# Patient Record
Sex: Male | Born: 1945 | Hispanic: No | Marital: Married | Smoking: Former smoker
Health system: Southern US, Community
[De-identification: ages and names within clinical notes are randomized; demographics above are authoritative.]

## PROBLEM LIST (undated history)

## (undated) DIAGNOSIS — D649 Anemia, unspecified: Secondary | ICD-10-CM

## (undated) DIAGNOSIS — I10 Essential (primary) hypertension: Secondary | ICD-10-CM

## (undated) DIAGNOSIS — E119 Type 2 diabetes mellitus without complications: Secondary | ICD-10-CM

## (undated) DIAGNOSIS — E785 Hyperlipidemia, unspecified: Secondary | ICD-10-CM

## (undated) DIAGNOSIS — N183 Chronic kidney disease, stage 3 unspecified: Secondary | ICD-10-CM

## (undated) HISTORY — DX: Type 2 diabetes mellitus without complications: E11.9

## (undated) HISTORY — DX: Chronic kidney disease, stage 3 unspecified: N18.30

## (undated) HISTORY — DX: Essential (primary) hypertension: I10

## (undated) HISTORY — DX: Anemia, unspecified: D64.9

## (undated) HISTORY — DX: Hyperlipidemia, unspecified: E78.5

## (undated) HISTORY — PX: OTHER SURGICAL HISTORY: SHX169

---

## 2003-07-26 ENCOUNTER — Ambulatory Visit (HOSPITAL_COMMUNITY): Admission: RE | Admit: 2003-07-26 | Discharge: 2003-07-26 | Payer: Self-pay | Admitting: Gastroenterology

## 2015-02-01 ENCOUNTER — Other Ambulatory Visit: Payer: Self-pay | Admitting: Gastroenterology

## 2018-08-14 DIAGNOSIS — I10 Essential (primary) hypertension: Secondary | ICD-10-CM | POA: Diagnosis not present

## 2018-08-14 DIAGNOSIS — N183 Chronic kidney disease, stage 3 (moderate): Secondary | ICD-10-CM | POA: Diagnosis not present

## 2018-08-14 DIAGNOSIS — E7800 Pure hypercholesterolemia, unspecified: Secondary | ICD-10-CM | POA: Diagnosis not present

## 2018-08-14 DIAGNOSIS — E1122 Type 2 diabetes mellitus with diabetic chronic kidney disease: Secondary | ICD-10-CM | POA: Diagnosis not present

## 2019-01-01 ENCOUNTER — Other Ambulatory Visit: Payer: Self-pay | Admitting: Family Medicine

## 2019-01-01 ENCOUNTER — Encounter: Payer: Self-pay | Admitting: Cardiology

## 2019-01-01 DIAGNOSIS — Z23 Encounter for immunization: Secondary | ICD-10-CM | POA: Diagnosis not present

## 2019-01-01 DIAGNOSIS — Z125 Encounter for screening for malignant neoplasm of prostate: Secondary | ICD-10-CM | POA: Diagnosis not present

## 2019-01-01 DIAGNOSIS — E1122 Type 2 diabetes mellitus with diabetic chronic kidney disease: Secondary | ICD-10-CM | POA: Diagnosis not present

## 2019-01-01 DIAGNOSIS — I10 Essential (primary) hypertension: Secondary | ICD-10-CM | POA: Diagnosis not present

## 2019-01-01 DIAGNOSIS — Z0000 Encounter for general adult medical examination without abnormal findings: Secondary | ICD-10-CM | POA: Diagnosis not present

## 2019-01-01 DIAGNOSIS — Z136 Encounter for screening for cardiovascular disorders: Secondary | ICD-10-CM

## 2019-01-01 DIAGNOSIS — N1831 Chronic kidney disease, stage 3a: Secondary | ICD-10-CM | POA: Diagnosis not present

## 2019-01-01 DIAGNOSIS — D229 Melanocytic nevi, unspecified: Secondary | ICD-10-CM | POA: Diagnosis not present

## 2019-01-01 DIAGNOSIS — E7800 Pure hypercholesterolemia, unspecified: Secondary | ICD-10-CM | POA: Diagnosis not present

## 2019-01-01 DIAGNOSIS — R9431 Abnormal electrocardiogram [ECG] [EKG]: Secondary | ICD-10-CM | POA: Diagnosis not present

## 2019-03-03 ENCOUNTER — Ambulatory Visit
Admission: RE | Admit: 2019-03-03 | Discharge: 2019-03-03 | Disposition: A | Discharge status: Home / Self Care | Payer: Medicare HMO | Source: Ambulatory Visit | Attending: Family Medicine | Admitting: Family Medicine

## 2019-03-03 DIAGNOSIS — Z136 Encounter for screening for cardiovascular disorders: Secondary | ICD-10-CM | POA: Diagnosis not present

## 2019-03-03 DIAGNOSIS — Z87891 Personal history of nicotine dependence: Secondary | ICD-10-CM | POA: Diagnosis not present

## 2019-03-05 NOTE — Progress Notes (Signed)
Clinton Forts, MD Reason for referral-abnormal ECG  HPI: 74 year old male for evaluation of abnormal ECG at request of Antony Contras, MD.  Abdominal ultrasound January 2021 showed no aneurysm.  Laboratories November 2020 showed total cholesterol 173 with LDL 77.  Liver functions normal.  Recent electrocardiogram said to have inferior Q waves and cardiology asked to evaluate.  Patient denies dyspnea, chest pain, palpitations or syncope.  No claudication.  Current Outpatient Medications  Medication Sig Dispense Refill  . ACCU-CHEK AVIVA PLUS test strip     . Blood Glucose Calibration (ACCU-CHEK AVIVA) SOLN     . diltiazem (CARDIZEM CD) 300 MG 24 hr capsule Take 300 mg by mouth daily.    Marland Kitchen glimepiride (AMARYL) 2 MG tablet Take 1 tablet by mouth daily.    Marland Kitchen lisinopril (ZESTRIL) 20 MG tablet Take 1 tablet by mouth daily.    . rosuvastatin (CRESTOR) 5 MG tablet Take 1 tablet by mouth daily.     No current facility-administered medications for this visit.    Not on File   Past Medical History:  Diagnosis Date  . Anemia   . Chronic kidney disease (CKD), stage III (moderate)   . Diabetes mellitus ([place])   . Hyperlipidemia   . Hypertension     Past Surgical History:  Procedure Laterality Date  . No prior surgery      Social History   Socioeconomic History  . Marital status: Married    Spouse name: Not on file  . Number of children: Not on file  . Years of education: Not on file  . Highest education level: Not on file  Occupational History  . Not on file  Tobacco Use  . Smoking status: Former Research scientist (life sciences)  . Smokeless tobacco: Never Used  Substance and Sexual Activity  . Alcohol use: Never  . Drug use: Not on file  . Sexual activity: Not on file  Other Topics Concern  . Not on file  Social History Narrative  . Not on file   Social Determinants of Health   Financial Resource Strain:   . Difficulty of Paying Living Expenses: Not on file  Food Insecurity:   .  Worried About Charity fundraiser in the Last Year: Not on file  . Ran Out of Food in the Last Year: Not on file  Transportation Needs:   . Lack of Transportation (Medical): Not on file  . Lack of Transportation (Non-Medical): Not on file  Physical Activity:   . Days of Exercise per Week: Not on file  . Minutes of Exercise per Session: Not on file  Stress:   . Feeling of Stress : Not on file  Social Connections:   . Frequency of Communication with Friends and Family: Not on file  . Frequency of Social Gatherings with Friends and Family: Not on file  . Attends Religious Services: Not on file  . Active Member of Clubs or Organizations: Not on file  . Attends Archivist Meetings: Not on file  . Marital Status: Not on file  Intimate Partner Violence:   . Fear of Current or Ex-Partner: Not on file  . Emotionally Abused: Not on file  . Physically Abused: Not on file  . Sexually Abused: Not on file    Family History  Problem Relation Age of Onset  . Hypertension Father     ROS: Arthralgias but no fevers or chills, productive cough, hemoptysis, dysphasia, odynophagia, melena, hematochezia, dysuria, hematuria, rash, seizure activity, orthopnea, PND, pedal  edema, claudication. Remaining systems are negative.  Physical Exam:   Blood pressure (!) 158/88, pulse 97, height 5\' 6"  (1.676 m), weight 154 lb (69.9 kg), SpO2 95 %.  General:  Well developed/well nourished in NAD Skin warm/dry Patient not depressed No peripheral clubbing Back-normal HEENT-normal/normal eyelids Neck supple/normal carotid upstroke bilaterally; no bruits; no JVD; no thyromegaly chest - CTA/ normal expansion CV - RRR/normal S1 and S2; no murmurs, rubs or gallops;  PMI nondisplaced Abdomen -NT/ND, no HSM, no mass, + bowel sounds, positive bruit 2+ femoral pulses, no bruits Ext-no edema, chords, 2+ DP Neuro-grossly nonfocal  ECG -normal sinus rhythm, inferior infarct, cannot rule out septal infarct.   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  Patient has no prior cardiac symptoms including no chest pain or dyspnea.  I will arrange an echocardiogram to quantify LV function.  We will arrange a stress nuclear study to screen for prior infarct and to rule out ischemia.  Continue aspirin and statin.  2 bruit-arrange abdominal ultrasound to exclude aneurysm.  3 hyperlipidemia-continue statin.  If coronary artery disease demonstrated would need to increase dose.  4 hypertension-blood pressure elevated today but he follows this at home and it is typically controlled.  Continue present medications and follow.  Kirk Ruths, MD

## 2019-03-10 ENCOUNTER — Encounter: Payer: Self-pay | Admitting: Cardiology

## 2019-03-10 ENCOUNTER — Ambulatory Visit (INDEPENDENT_AMBULATORY_CARE_PROVIDER_SITE_OTHER): Payer: Medicare HMO | Admitting: Cardiology

## 2019-03-10 ENCOUNTER — Other Ambulatory Visit: Payer: Self-pay

## 2019-03-10 VITALS — BP 158/88 | HR 97 | Ht 66.0 in | Wt 154.0 lb

## 2019-03-10 DIAGNOSIS — R9431 Abnormal electrocardiogram [ECG] [EKG]: Secondary | ICD-10-CM | POA: Diagnosis not present

## 2019-03-10 DIAGNOSIS — I10 Essential (primary) hypertension: Secondary | ICD-10-CM

## 2019-03-10 DIAGNOSIS — E7800 Pure hypercholesterolemia, unspecified: Secondary | ICD-10-CM | POA: Diagnosis not present

## 2019-03-10 NOTE — Patient Instructions (Signed)
Medication Instructions:  NO CHANGE *If you need a refill on your cardiac medications before your next appointment, please call your pharmacy*  Lab Work: If you have labs (blood work) drawn today and your tests are completely normal, you will receive your results only by: Marland Kitchen MyChart Message (if you have MyChart) OR . A paper copy in the mail If you have any lab test that is abnormal or we need to change your treatment, we will call you to review the results.  Testing/Procedures: Your physician has requested that you have an echocardiogram. Echocardiography is a painless test that uses sound waves to create images of your heart. It provides your doctor with information about the size and shape of your heart and how well your heart's chambers and valves are working. This procedure takes approximately one hour. There are no restrictions for this procedure.[place] has requested that you have a lexiscan myoview. For further information please visit [email or web address]. Please follow instruction sheet, as given.Jane Lew has requested that you have an abdominal aorta duplex. During this test, an ultrasound is used to evaluate the aorta. Allow 30 minutes for this exam. Do not eat after midnight the day before and avoid carbonated beverages [facility]   Follow-Up: At [facility], you and your health needs are our priority.  As part of our continuing mission to provide you with exceptional heart care, we have created designated Provider Care Teams.  These Care Teams include your primary Cardiologist (physician) and Advanced Practice Providers (APPs -  Physician Assistants and Nurse Practitioners) who all work together to provide you with the care you need, when you need it.  Your next appointment:   3 month(s)  The format for your next appointment:   In Person  Provider:   Kirk Ruths, MD

## 2019-03-11 ENCOUNTER — Other Ambulatory Visit: Payer: Self-pay | Admitting: Cardiology

## 2019-03-11 ENCOUNTER — Telehealth (HOSPITAL_COMMUNITY): Payer: Self-pay

## 2019-03-11 DIAGNOSIS — R0989 Other specified symptoms and signs involving the circulatory and respiratory systems: Secondary | ICD-10-CM

## 2019-03-11 NOTE — Telephone Encounter (Signed)
Encounter complete. 

## 2019-03-12 ENCOUNTER — Telehealth (HOSPITAL_COMMUNITY): Payer: Self-pay

## 2019-03-12 NOTE — Telephone Encounter (Signed)
Encounter complete. 

## 2019-03-13 ENCOUNTER — Ambulatory Visit (HOSPITAL_COMMUNITY)
Admission: RE | Admit: 2019-03-13 | Discharge: 2019-03-13 | Disposition: A | Discharge status: Home / Self Care | Payer: Medicare HMO | Source: Ambulatory Visit | Attending: Internal Medicine | Admitting: Internal Medicine

## 2019-03-13 ENCOUNTER — Other Ambulatory Visit: Payer: Self-pay

## 2019-03-13 DIAGNOSIS — Z7982 Long term (current) use of aspirin: Secondary | ICD-10-CM | POA: Diagnosis not present

## 2019-03-13 DIAGNOSIS — Z79899 Other long term (current) drug therapy: Secondary | ICD-10-CM | POA: Insufficient documentation

## 2019-03-13 DIAGNOSIS — I129 Hypertensive chronic kidney disease with stage 1 through stage 4 chronic kidney disease, or unspecified chronic kidney disease: Secondary | ICD-10-CM | POA: Diagnosis not present

## 2019-03-13 DIAGNOSIS — R9431 Abnormal electrocardiogram [ECG] [EKG]: Secondary | ICD-10-CM | POA: Insufficient documentation

## 2019-03-13 DIAGNOSIS — Z7984 Long term (current) use of oral hypoglycemic drugs: Secondary | ICD-10-CM | POA: Diagnosis not present

## 2019-03-13 DIAGNOSIS — E1122 Type 2 diabetes mellitus with diabetic chronic kidney disease: Secondary | ICD-10-CM | POA: Insufficient documentation

## 2019-03-13 DIAGNOSIS — N183 Chronic kidney disease, stage 3 unspecified: Secondary | ICD-10-CM | POA: Insufficient documentation

## 2019-03-13 DIAGNOSIS — Z87891 Personal history of nicotine dependence: Secondary | ICD-10-CM | POA: Diagnosis not present

## 2019-03-13 LAB — MYOCARDIAL PERFUSION IMAGING
LV dias vol: 58 mL (ref 62–150)
LV sys vol: 21 mL
Peak HR: 110 {beats}/min
Rest HR: 81 {beats}/min
SDS: 8
SRS: 0
SSS: 8
TID: 0.95

## 2019-03-13 MED ORDER — TECHNETIUM TC 99M TETROFOSMIN IV KIT
9.6000 | PACK | Freq: Once | INTRAVENOUS | Status: AC | PRN
  Administered 2019-03-13: 9.6 via INTRAVENOUS
  Filled 2019-03-13: qty 10

## 2019-03-13 MED ORDER — TECHNETIUM TC 99M TETROFOSMIN IV KIT
31.2000 | PACK | Freq: Once | INTRAVENOUS | Status: AC | PRN
  Administered 2019-03-13: 31.2 via INTRAVENOUS
  Filled 2019-03-13: qty 32

## 2019-03-13 MED ORDER — REGADENOSON 0.4 MG/5ML IV SOLN
0.4000 mg | Freq: Once | INTRAVENOUS | Status: AC
  Administered 2019-03-13: 0.4 mg via INTRAVENOUS

## 2019-03-19 ENCOUNTER — Encounter (HOSPITAL_COMMUNITY): Payer: Self-pay

## 2019-03-19 ENCOUNTER — Other Ambulatory Visit: Payer: Self-pay

## 2019-03-19 ENCOUNTER — Ambulatory Visit (HOSPITAL_COMMUNITY)
Admission: RE | Admit: 2019-03-19 | Discharge: 2019-03-19 | Disposition: A | Discharge status: Home / Self Care | Payer: Medicare HMO | Source: Ambulatory Visit | Attending: Cardiovascular Disease | Admitting: Cardiovascular Disease

## 2019-03-19 DIAGNOSIS — R0989 Other specified symptoms and signs involving the circulatory and respiratory systems: Secondary | ICD-10-CM

## 2019-03-20 ENCOUNTER — Ambulatory Visit (HOSPITAL_COMMUNITY): Payer: Medicare HMO | Attending: Cardiology

## 2019-03-20 DIAGNOSIS — R9431 Abnormal electrocardiogram [ECG] [EKG]: Secondary | ICD-10-CM

## 2019-05-07 DIAGNOSIS — E785 Hyperlipidemia, unspecified: Secondary | ICD-10-CM | POA: Diagnosis not present

## 2019-05-07 DIAGNOSIS — E1165 Type 2 diabetes mellitus with hyperglycemia: Secondary | ICD-10-CM | POA: Diagnosis not present

## 2019-05-07 DIAGNOSIS — E1122 Type 2 diabetes mellitus with diabetic chronic kidney disease: Secondary | ICD-10-CM | POA: Diagnosis not present

## 2019-05-07 DIAGNOSIS — I10 Essential (primary) hypertension: Secondary | ICD-10-CM | POA: Diagnosis not present

## 2019-05-07 DIAGNOSIS — N183 Chronic kidney disease, stage 3 unspecified: Secondary | ICD-10-CM | POA: Diagnosis not present

## 2019-05-07 DIAGNOSIS — E119 Type 2 diabetes mellitus without complications: Secondary | ICD-10-CM | POA: Diagnosis not present

## 2019-05-07 DIAGNOSIS — D509 Iron deficiency anemia, unspecified: Secondary | ICD-10-CM | POA: Diagnosis not present

## 2019-05-07 DIAGNOSIS — E7800 Pure hypercholesterolemia, unspecified: Secondary | ICD-10-CM | POA: Diagnosis not present

## 2019-06-30 NOTE — Progress Notes (Deleted)
HPI: Follow-up abnormal ECG. Abdominal ultrasound January 2021 showed no aneurysm.  Nuclear study 1/21 showed EF 64 and no ischemia or infarction. Echo 2/21 showed normal LV function, grade 1 diastolic dysfunction.  Current Outpatient Medications  Medication Sig Dispense Refill  . ACCU-CHEK AVIVA PLUS test strip     . Blood Glucose Calibration (ACCU-CHEK AVIVA) SOLN     . diltiazem (CARDIZEM CD) 300 MG 24 hr capsule Take 300 mg by mouth daily.    Marland Kitchen glimepiride (AMARYL) 2 MG tablet Take 1 tablet by mouth daily.    Marland Kitchen lisinopril (ZESTRIL) 20 MG tablet Take 1 tablet by mouth daily.    . rosuvastatin (CRESTOR) 5 MG tablet Take 1 tablet by mouth daily.     No current facility-administered medications for this visit.     Past Medical History:  Diagnosis Date  . Anemia   . Chronic kidney disease (CKD), stage III (moderate)   . Diabetes mellitus ([place])   . Hyperlipidemia   . Hypertension     Past Surgical History:  Procedure Laterality Date  . No prior surgery      Social History   Socioeconomic History  . Marital status: Married    Spouse name: Not on file  . Number of children: Not on file  . Years of education: Not on file  . Highest education level: Not on file  Occupational History  . Not on file  Tobacco Use  . Smoking status: Former Research scientist (life sciences)  . Smokeless tobacco: Never Used  Substance and Sexual Activity  . Alcohol use: Never  . Drug use: Not on file  . Sexual activity: Not on file  Other Topics Concern  . Not on file  Social History Narrative  . Not on file   Social Determinants of Health   Financial Resource Strain:   . Difficulty of Paying Living Expenses:   Food Insecurity:   . Worried About Charity fundraiser in the Last Year:   . Arboriculturist in the Last Year:   Transportation Needs:   . Film/video editor (Medical):   Marland Kitchen Lack of Transportation (Non-Medical):   Physical Activity:   . Days of Exercise per Week:   . Minutes of Exercise  per Session:   Stress:   . Feeling of Stress :   Social Connections:   . Frequency of Communication with Friends and Family:   . Frequency of Social Gatherings with Friends and Family:   . Attends Religious Services:   . Active Member of Clubs or Organizations:   . Attends Archivist Meetings:   Marland Kitchen Marital Status:   Intimate Partner Violence:   . Fear of Current or Ex-Partner:   . Emotionally Abused:   Marland Kitchen Physically Abused:   . Sexually Abused:     Family History  Problem Relation Age of Onset  . Hypertension Father     ROS: no fevers or chills, productive cough, hemoptysis, dysphasia, odynophagia, melena, hematochezia, dysuria, hematuria, rash, seizure activity, orthopnea, PND, pedal edema, claudication. Remaining systems are negative.  Physical Exam: Well-developed well-nourished in no acute distress.  Skin is warm and dry.  HEENT is normal.  Neck is supple.  Chest is clear to auscultation with normal expansion.  Cardiovascular exam is regular rate and rhythm.  Abdominal exam nontender or distended. No masses palpated. Extremities show no edema. neuro grossly intact  ECG- personally reviewed  A/P  1 abnormal ECG-there was a question of inferior infarct  on prior ECG but echocardiogram shows normal wall motion/normal LV function and nuclear study showed normal perfusion.  No further evaluation.  2 hypertension-blood pressure controlled.  Continue present medical regimen.  3 hyperlipidemia-continue statin.  Kirk Ruths, MD

## 2019-07-03 DIAGNOSIS — E1122 Type 2 diabetes mellitus with diabetic chronic kidney disease: Secondary | ICD-10-CM | POA: Diagnosis not present

## 2019-07-03 DIAGNOSIS — D229 Melanocytic nevi, unspecified: Secondary | ICD-10-CM | POA: Diagnosis not present

## 2019-07-03 DIAGNOSIS — N1831 Chronic kidney disease, stage 3a: Secondary | ICD-10-CM | POA: Diagnosis not present

## 2019-07-03 DIAGNOSIS — I10 Essential (primary) hypertension: Secondary | ICD-10-CM | POA: Diagnosis not present

## 2019-07-03 DIAGNOSIS — I700 Atherosclerosis of aorta: Secondary | ICD-10-CM | POA: Diagnosis not present

## 2019-07-03 DIAGNOSIS — E7800 Pure hypercholesterolemia, unspecified: Secondary | ICD-10-CM | POA: Diagnosis not present

## 2019-07-07 ENCOUNTER — Ambulatory Visit: Payer: Medicare HMO | Admitting: Cardiology

## 2019-07-07 DIAGNOSIS — E1165 Type 2 diabetes mellitus with hyperglycemia: Secondary | ICD-10-CM | POA: Diagnosis not present

## 2019-07-07 DIAGNOSIS — E1122 Type 2 diabetes mellitus with diabetic chronic kidney disease: Secondary | ICD-10-CM | POA: Diagnosis not present

## 2019-07-07 DIAGNOSIS — E785 Hyperlipidemia, unspecified: Secondary | ICD-10-CM | POA: Diagnosis not present

## 2019-07-07 DIAGNOSIS — E119 Type 2 diabetes mellitus without complications: Secondary | ICD-10-CM | POA: Diagnosis not present

## 2019-07-07 DIAGNOSIS — E7800 Pure hypercholesterolemia, unspecified: Secondary | ICD-10-CM | POA: Diagnosis not present

## 2019-07-07 DIAGNOSIS — D509 Iron deficiency anemia, unspecified: Secondary | ICD-10-CM | POA: Diagnosis not present

## 2019-07-07 DIAGNOSIS — I10 Essential (primary) hypertension: Secondary | ICD-10-CM | POA: Diagnosis not present

## 2019-07-07 DIAGNOSIS — N183 Chronic kidney disease, stage 3 unspecified: Secondary | ICD-10-CM | POA: Diagnosis not present

## 2019-10-27 DIAGNOSIS — E119 Type 2 diabetes mellitus without complications: Secondary | ICD-10-CM | POA: Diagnosis not present

## 2019-10-27 DIAGNOSIS — N183 Chronic kidney disease, stage 3 unspecified: Secondary | ICD-10-CM | POA: Diagnosis not present

## 2019-10-27 DIAGNOSIS — I10 Essential (primary) hypertension: Secondary | ICD-10-CM | POA: Diagnosis not present

## 2019-10-27 DIAGNOSIS — E1165 Type 2 diabetes mellitus with hyperglycemia: Secondary | ICD-10-CM | POA: Diagnosis not present

## 2019-10-27 DIAGNOSIS — E785 Hyperlipidemia, unspecified: Secondary | ICD-10-CM | POA: Diagnosis not present

## 2019-10-27 DIAGNOSIS — E1122 Type 2 diabetes mellitus with diabetic chronic kidney disease: Secondary | ICD-10-CM | POA: Diagnosis not present

## 2019-10-27 DIAGNOSIS — E7800 Pure hypercholesterolemia, unspecified: Secondary | ICD-10-CM | POA: Diagnosis not present

## 2019-10-27 DIAGNOSIS — D509 Iron deficiency anemia, unspecified: Secondary | ICD-10-CM | POA: Diagnosis not present

## 2019-11-12 DIAGNOSIS — H5200 Hypermetropia, unspecified eye: Secondary | ICD-10-CM | POA: Diagnosis not present

## 2019-11-12 DIAGNOSIS — H259 Unspecified age-related cataract: Secondary | ICD-10-CM | POA: Diagnosis not present

## 2019-11-12 DIAGNOSIS — Z135 Encounter for screening for eye and ear disorders: Secondary | ICD-10-CM | POA: Diagnosis not present

## 2019-11-12 DIAGNOSIS — Z0100 Encounter for examination of eyes and vision without abnormal findings: Secondary | ICD-10-CM | POA: Diagnosis not present

## 2020-01-14 DIAGNOSIS — I10 Essential (primary) hypertension: Secondary | ICD-10-CM | POA: Diagnosis not present

## 2020-01-14 DIAGNOSIS — E7800 Pure hypercholesterolemia, unspecified: Secondary | ICD-10-CM | POA: Diagnosis not present

## 2020-01-14 DIAGNOSIS — Z0000 Encounter for general adult medical examination without abnormal findings: Secondary | ICD-10-CM | POA: Diagnosis not present

## 2020-01-14 DIAGNOSIS — Z1389 Encounter for screening for other disorder: Secondary | ICD-10-CM | POA: Diagnosis not present

## 2020-01-14 DIAGNOSIS — E1122 Type 2 diabetes mellitus with diabetic chronic kidney disease: Secondary | ICD-10-CM | POA: Diagnosis not present

## 2020-01-14 DIAGNOSIS — Z1211 Encounter for screening for malignant neoplasm of colon: Secondary | ICD-10-CM | POA: Diagnosis not present

## 2020-01-14 DIAGNOSIS — N1831 Chronic kidney disease, stage 3a: Secondary | ICD-10-CM | POA: Diagnosis not present

## 2020-01-14 DIAGNOSIS — Z122 Encounter for screening for malignant neoplasm of respiratory organs: Secondary | ICD-10-CM | POA: Diagnosis not present

## 2020-01-14 DIAGNOSIS — Z7984 Long term (current) use of oral hypoglycemic drugs: Secondary | ICD-10-CM | POA: Diagnosis not present

## 2020-01-14 DIAGNOSIS — I700 Atherosclerosis of aorta: Secondary | ICD-10-CM | POA: Diagnosis not present

## 2020-07-14 DIAGNOSIS — K640 First degree hemorrhoids: Secondary | ICD-10-CM | POA: Diagnosis not present

## 2020-07-14 DIAGNOSIS — K5730 Diverticulosis of large intestine without perforation or abscess without bleeding: Secondary | ICD-10-CM | POA: Diagnosis not present

## 2020-07-14 DIAGNOSIS — Z86010 Personal history of colonic polyps: Secondary | ICD-10-CM | POA: Diagnosis not present

## 2020-07-19 DIAGNOSIS — Z7984 Long term (current) use of oral hypoglycemic drugs: Secondary | ICD-10-CM | POA: Diagnosis not present

## 2020-07-19 DIAGNOSIS — N1831 Chronic kidney disease, stage 3a: Secondary | ICD-10-CM | POA: Diagnosis not present

## 2020-07-19 DIAGNOSIS — I10 Essential (primary) hypertension: Secondary | ICD-10-CM | POA: Diagnosis not present

## 2020-07-19 DIAGNOSIS — E7800 Pure hypercholesterolemia, unspecified: Secondary | ICD-10-CM | POA: Diagnosis not present

## 2020-07-19 DIAGNOSIS — E1122 Type 2 diabetes mellitus with diabetic chronic kidney disease: Secondary | ICD-10-CM | POA: Diagnosis not present

## 2020-07-19 DIAGNOSIS — D72829 Elevated white blood cell count, unspecified: Secondary | ICD-10-CM | POA: Diagnosis not present

## 2020-07-19 DIAGNOSIS — I700 Atherosclerosis of aorta: Secondary | ICD-10-CM | POA: Diagnosis not present

## 2020-11-01 DIAGNOSIS — H40033 Anatomical narrow angle, bilateral: Secondary | ICD-10-CM | POA: Diagnosis not present

## 2020-11-01 DIAGNOSIS — E119 Type 2 diabetes mellitus without complications: Secondary | ICD-10-CM | POA: Diagnosis not present

## 2020-11-05 IMAGING — US US ABDOMINAL AORTA SCREENING AAA
1 series · 14 of 25 positions shown · non-contrast
Comparison: None.

CLINICAL DATA: Male between 65-75 years of age with a smoking
history.

EXAM:
US ABDOMINAL AORTA MEDICARE SCREENING
TECHNIQUE: Ultrasound examination of the abdominal aorta was performed as a
screening evaluation for abdominal aortic aneurysm.

[Series 1: us abdominal aorta screening aaa · 0.27mm/px · 14 of 27 slices shown]
[im 1/27]
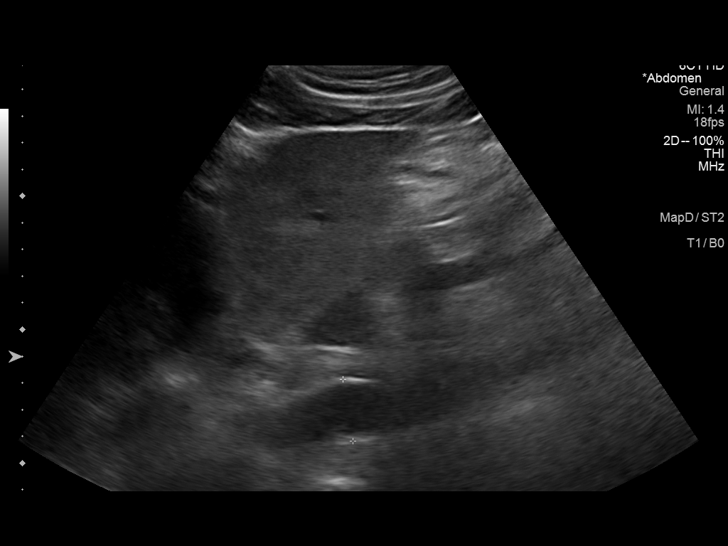
[im 3/27]
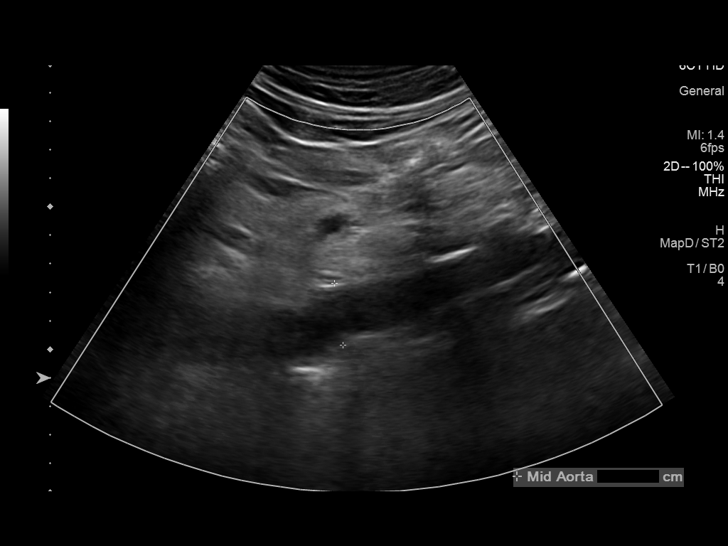
[im 5/27]
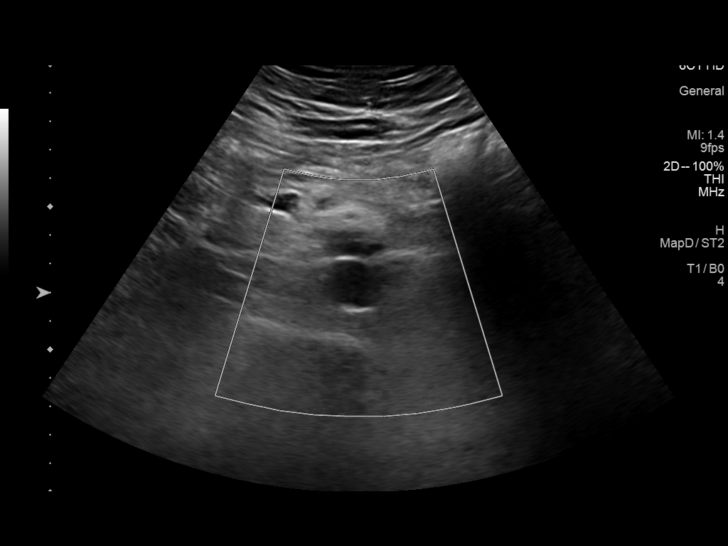
[im 7/27]
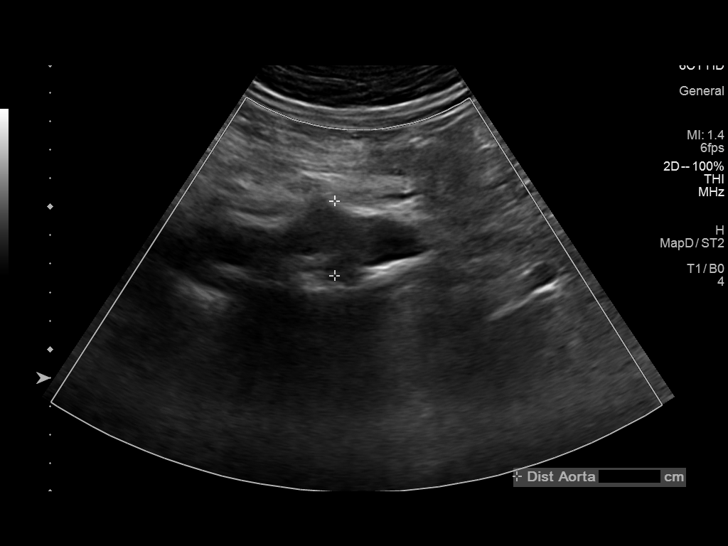
[im 9/27]
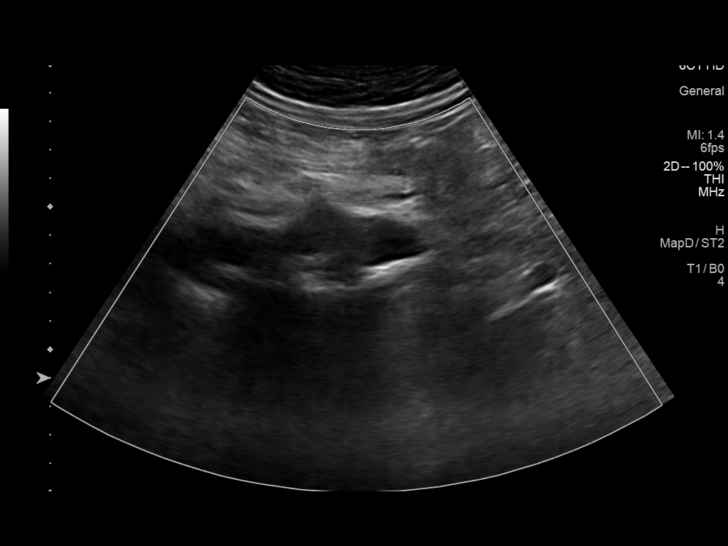
[im 10/27]
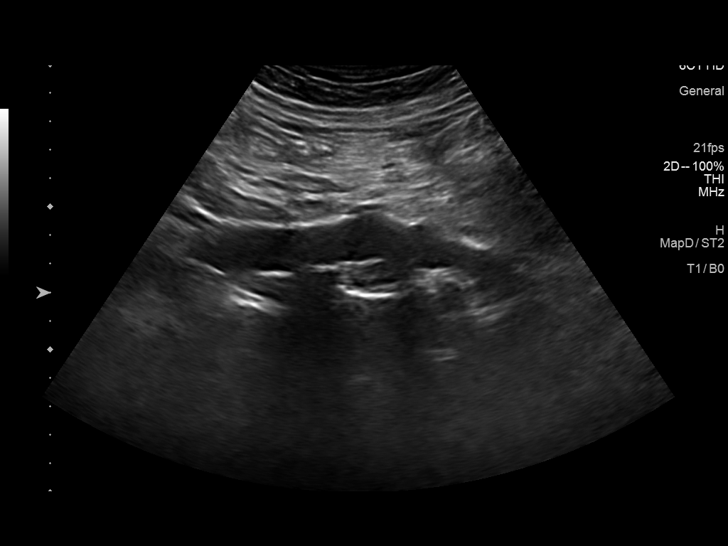
[im 12/27]
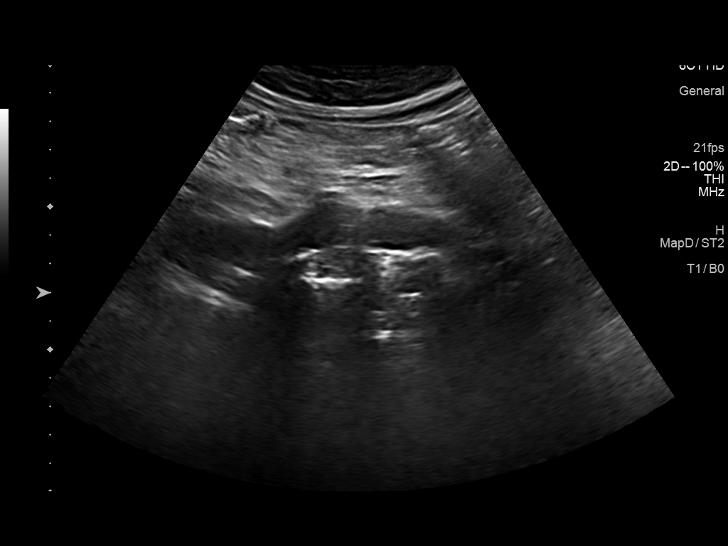
[im 15/27]
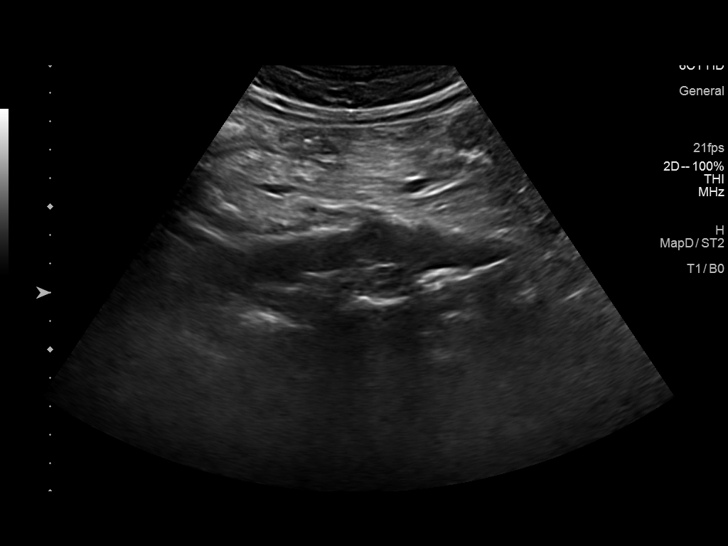
[im 17/27]
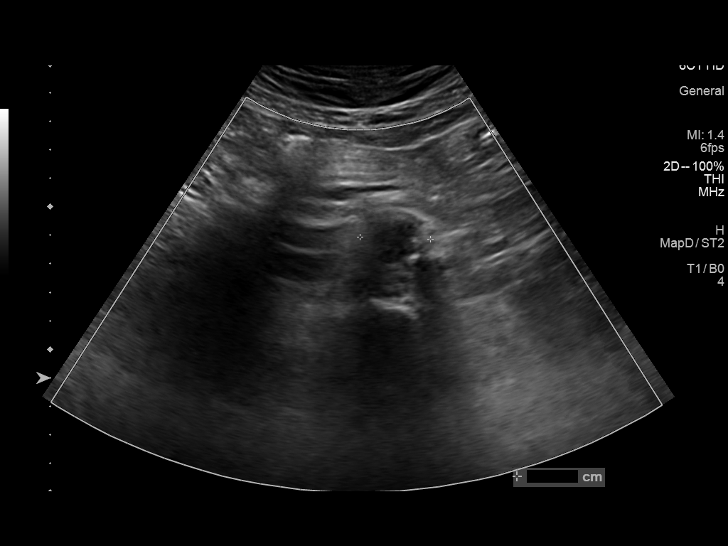
[im 18/27]
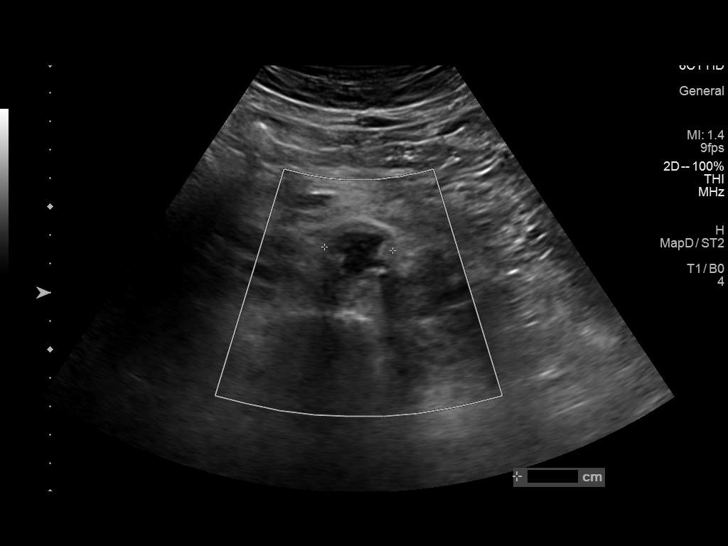
[im 20/27]
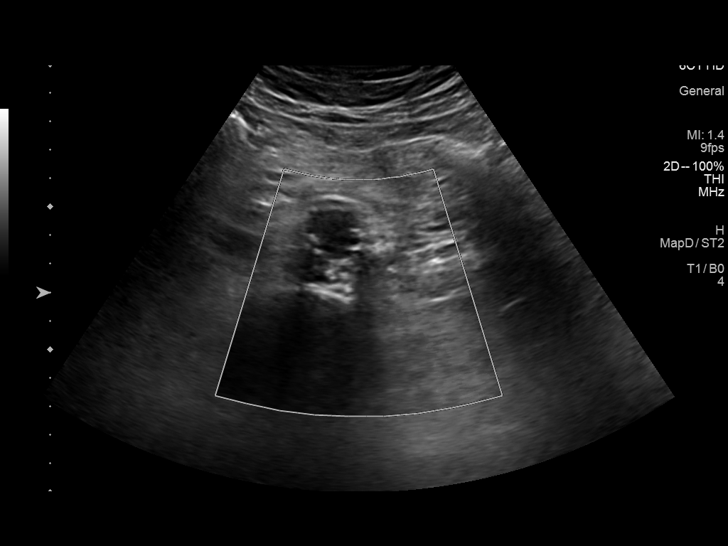
[im 22/27]
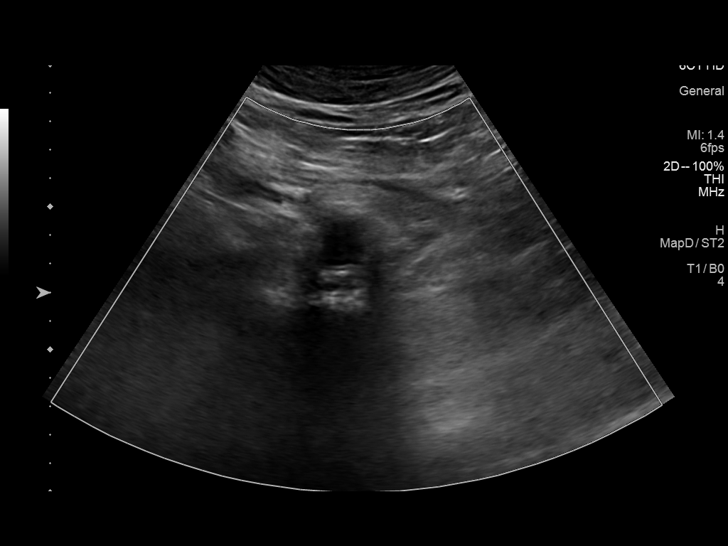
[im 24/27]
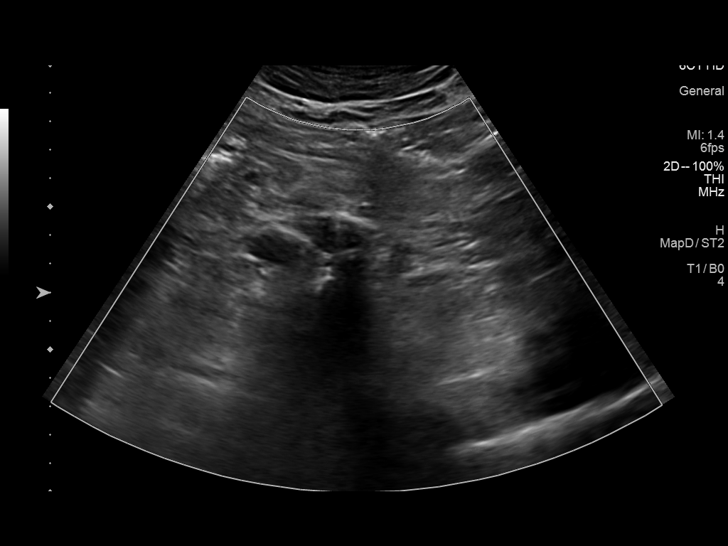
[im 27/27]
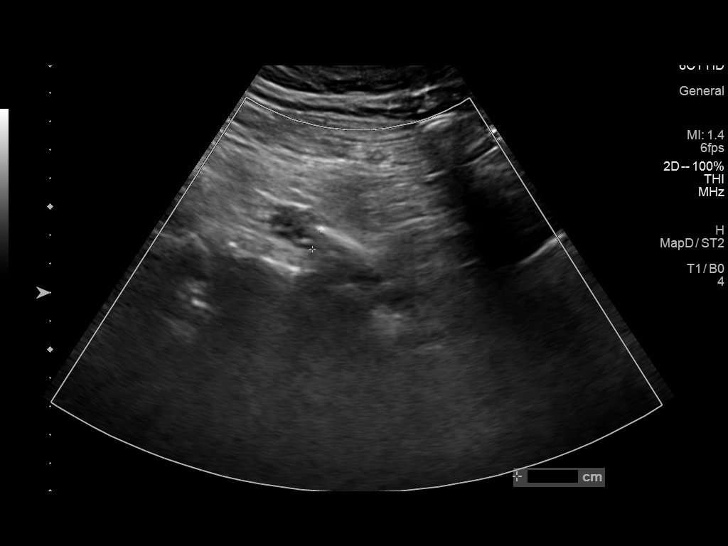

[14 of 25 positions shown; findings below may reference images not displayed]

FINDINGS: Aortic atherosclerotic plaque noted.

Abdominal aortic measurements as follows:

Proximal:  2.4 cm

Mid:  2.2 cm

Distal:  2.6 cm
IMPRESSION: No evidence of abdominal aortic aneurysm.

## 2020-12-07 DIAGNOSIS — Z0100 Encounter for examination of eyes and vision without abnormal findings: Secondary | ICD-10-CM | POA: Diagnosis not present

## 2021-02-23 DIAGNOSIS — I10 Essential (primary) hypertension: Secondary | ICD-10-CM | POA: Diagnosis not present

## 2021-02-23 DIAGNOSIS — H9193 Unspecified hearing loss, bilateral: Secondary | ICD-10-CM | POA: Diagnosis not present

## 2021-02-23 DIAGNOSIS — I700 Atherosclerosis of aorta: Secondary | ICD-10-CM | POA: Diagnosis not present

## 2021-02-23 DIAGNOSIS — E7800 Pure hypercholesterolemia, unspecified: Secondary | ICD-10-CM | POA: Diagnosis not present

## 2021-02-23 DIAGNOSIS — E1122 Type 2 diabetes mellitus with diabetic chronic kidney disease: Secondary | ICD-10-CM | POA: Diagnosis not present

## 2021-02-23 DIAGNOSIS — Z0000 Encounter for general adult medical examination without abnormal findings: Secondary | ICD-10-CM | POA: Diagnosis not present

## 2021-02-23 DIAGNOSIS — N1831 Chronic kidney disease, stage 3a: Secondary | ICD-10-CM | POA: Diagnosis not present

## 2021-02-23 DIAGNOSIS — Z7984 Long term (current) use of oral hypoglycemic drugs: Secondary | ICD-10-CM | POA: Diagnosis not present

## 2021-02-23 DIAGNOSIS — Z1211 Encounter for screening for malignant neoplasm of colon: Secondary | ICD-10-CM | POA: Diagnosis not present

## 2021-02-23 DIAGNOSIS — Z1389 Encounter for screening for other disorder: Secondary | ICD-10-CM | POA: Diagnosis not present

## 2021-10-13 DIAGNOSIS — I10 Essential (primary) hypertension: Secondary | ICD-10-CM | POA: Diagnosis not present

## 2021-10-13 DIAGNOSIS — E7800 Pure hypercholesterolemia, unspecified: Secondary | ICD-10-CM | POA: Diagnosis not present

## 2021-10-13 DIAGNOSIS — Z7984 Long term (current) use of oral hypoglycemic drugs: Secondary | ICD-10-CM | POA: Diagnosis not present

## 2021-10-13 DIAGNOSIS — E1122 Type 2 diabetes mellitus with diabetic chronic kidney disease: Secondary | ICD-10-CM | POA: Diagnosis not present

## 2021-10-13 DIAGNOSIS — N1831 Chronic kidney disease, stage 3a: Secondary | ICD-10-CM | POA: Diagnosis not present

## 2021-10-13 DIAGNOSIS — I700 Atherosclerosis of aorta: Secondary | ICD-10-CM | POA: Diagnosis not present

## 2022-01-18 DIAGNOSIS — H5203 Hypermetropia, bilateral: Secondary | ICD-10-CM | POA: Diagnosis not present

## 2022-01-19 DIAGNOSIS — H524 Presbyopia: Secondary | ICD-10-CM | POA: Diagnosis not present

## 2022-03-23 DIAGNOSIS — I10 Essential (primary) hypertension: Secondary | ICD-10-CM | POA: Diagnosis not present

## 2022-03-23 DIAGNOSIS — Z23 Encounter for immunization: Secondary | ICD-10-CM | POA: Diagnosis not present

## 2022-03-23 DIAGNOSIS — N1831 Chronic kidney disease, stage 3a: Secondary | ICD-10-CM | POA: Diagnosis not present

## 2022-03-23 DIAGNOSIS — I700 Atherosclerosis of aorta: Secondary | ICD-10-CM | POA: Diagnosis not present

## 2022-03-23 DIAGNOSIS — E1122 Type 2 diabetes mellitus with diabetic chronic kidney disease: Secondary | ICD-10-CM | POA: Diagnosis not present

## 2022-03-23 DIAGNOSIS — E7800 Pure hypercholesterolemia, unspecified: Secondary | ICD-10-CM | POA: Diagnosis not present

## 2022-03-23 DIAGNOSIS — Z1211 Encounter for screening for malignant neoplasm of colon: Secondary | ICD-10-CM | POA: Diagnosis not present

## 2022-03-23 DIAGNOSIS — Z0000 Encounter for general adult medical examination without abnormal findings: Secondary | ICD-10-CM | POA: Diagnosis not present

## 2022-03-23 DIAGNOSIS — H9193 Unspecified hearing loss, bilateral: Secondary | ICD-10-CM | POA: Diagnosis not present

## 2022-09-28 DIAGNOSIS — E1122 Type 2 diabetes mellitus with diabetic chronic kidney disease: Secondary | ICD-10-CM | POA: Diagnosis not present

## 2022-09-28 DIAGNOSIS — I700 Atherosclerosis of aorta: Secondary | ICD-10-CM | POA: Diagnosis not present

## 2022-09-28 DIAGNOSIS — E1165 Type 2 diabetes mellitus with hyperglycemia: Secondary | ICD-10-CM | POA: Diagnosis not present

## 2022-09-28 DIAGNOSIS — R059 Cough, unspecified: Secondary | ICD-10-CM | POA: Diagnosis not present

## 2022-09-28 DIAGNOSIS — N1831 Chronic kidney disease, stage 3a: Secondary | ICD-10-CM | POA: Diagnosis not present

## 2022-09-28 DIAGNOSIS — I10 Essential (primary) hypertension: Secondary | ICD-10-CM | POA: Diagnosis not present

## 2022-09-28 DIAGNOSIS — E7800 Pure hypercholesterolemia, unspecified: Secondary | ICD-10-CM | POA: Diagnosis not present

## 2023-03-18 DIAGNOSIS — H9313 Tinnitus, bilateral: Secondary | ICD-10-CM | POA: Diagnosis not present

## 2023-03-18 DIAGNOSIS — H903 Sensorineural hearing loss, bilateral: Secondary | ICD-10-CM | POA: Diagnosis not present

## 2023-04-19 DIAGNOSIS — Z23 Encounter for immunization: Secondary | ICD-10-CM | POA: Diagnosis not present

## 2023-04-19 DIAGNOSIS — I10 Essential (primary) hypertension: Secondary | ICD-10-CM | POA: Diagnosis not present

## 2023-04-19 DIAGNOSIS — E7800 Pure hypercholesterolemia, unspecified: Secondary | ICD-10-CM | POA: Diagnosis not present

## 2023-04-19 DIAGNOSIS — M25549 Pain in joints of unspecified hand: Secondary | ICD-10-CM | POA: Diagnosis not present

## 2023-04-19 DIAGNOSIS — Z1211 Encounter for screening for malignant neoplasm of colon: Secondary | ICD-10-CM | POA: Diagnosis not present

## 2023-04-19 DIAGNOSIS — Z0000 Encounter for general adult medical examination without abnormal findings: Secondary | ICD-10-CM | POA: Diagnosis not present

## 2023-04-19 DIAGNOSIS — N1831 Chronic kidney disease, stage 3a: Secondary | ICD-10-CM | POA: Diagnosis not present

## 2023-04-19 DIAGNOSIS — E1122 Type 2 diabetes mellitus with diabetic chronic kidney disease: Secondary | ICD-10-CM | POA: Diagnosis not present

## 2023-04-19 DIAGNOSIS — H9193 Unspecified hearing loss, bilateral: Secondary | ICD-10-CM | POA: Diagnosis not present

## 2023-12-02 DIAGNOSIS — M25549 Pain in joints of unspecified hand: Secondary | ICD-10-CM | POA: Diagnosis not present

## 2023-12-02 DIAGNOSIS — E1165 Type 2 diabetes mellitus with hyperglycemia: Secondary | ICD-10-CM | POA: Diagnosis not present

## 2023-12-02 DIAGNOSIS — I10 Essential (primary) hypertension: Secondary | ICD-10-CM | POA: Diagnosis not present

## 2023-12-02 DIAGNOSIS — N1831 Chronic kidney disease, stage 3a: Secondary | ICD-10-CM | POA: Diagnosis not present

## 2023-12-02 DIAGNOSIS — E7800 Pure hypercholesterolemia, unspecified: Secondary | ICD-10-CM | POA: Diagnosis not present

## 2023-12-02 DIAGNOSIS — Z23 Encounter for immunization: Secondary | ICD-10-CM | POA: Diagnosis not present

## 2023-12-02 DIAGNOSIS — E1122 Type 2 diabetes mellitus with diabetic chronic kidney disease: Secondary | ICD-10-CM | POA: Diagnosis not present
